# Patient Record
Sex: Male | Born: 2000 | Race: White | Hispanic: No | Marital: Single | Smoking: Never smoker
Health system: Southern US, Community
[De-identification: ages and names within clinical notes are randomized; demographics above are authoritative.]

---
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
## 2021-07-23 ENCOUNTER — Encounter: Payer: Self-pay | Admitting: Emergency Medicine

## 2021-07-23 ENCOUNTER — Ambulatory Visit
Admission: EM | Admit: 2021-07-23 | Discharge: 2021-07-23 | Disposition: A | Discharge status: Home / Self Care | Payer: Managed Care, Other (non HMO) | Attending: Emergency Medicine | Admitting: Emergency Medicine

## 2021-07-23 ENCOUNTER — Other Ambulatory Visit: Payer: Self-pay

## 2021-07-23 DIAGNOSIS — Z1152 Encounter for screening for COVID-19: Secondary | ICD-10-CM

## 2021-07-23 DIAGNOSIS — B349 Viral infection, unspecified: Secondary | ICD-10-CM

## 2021-07-23 LAB — POCT RAPID STREP A (OFFICE): Rapid Strep A Screen: NEGATIVE

## 2021-07-23 NOTE — ED Triage Notes (Signed)
Pt here with sore throat, congestion, headache, body aches, and cough x 4 days.

## 2021-07-23 NOTE — Discharge Instructions (Addendum)
Your strep test is negative.    Your COVID test is pending.  You should self quarantine until the test result is back.    Take Tylenol or ibuprofen as needed for fever or discomfort.  Rest and keep yourself hydrated.    Follow-up with your primary care provider if your symptoms are not improving.     

## 2021-07-23 NOTE — ED Provider Notes (Addendum)
[facility]    CSN: [id number] Arrival date & time: 07/23/21  1432      History   Chief Complaint Chief Complaint  Patient presents with   Nasal Congestion   Sore Throat   Cough   Headache   Generalized Body Aches    HPI Clinton Franklin is a 21 y.o. male.  Patient presents with 3 day history of body aches, sore throat, hoarse voice, congestion, sneezing, runny nose, sinus pressure, cough.  Treatment at home with guaifenesin, Sudafed, ibuprofen.  No fever, rash, shortness of breath, vomiting, diarrhea, or other symptoms.  His medical history includes Influenza A on 06/12/2021.    The history is provided by the patient and medical records.   History reviewed. No pertinent past medical history.  There are no problems to display for this patient.   History reviewed. No pertinent surgical history.     Home Medications    Prior to Admission medications   Not on File    Family History History reviewed. No pertinent family history.  Social History Social History   Tobacco Use   Smoking status: Never   Smokeless tobacco: Never  Substance Use Topics   Alcohol use: Never   Drug use: Never     Allergies   Patient has no known allergies.   Review of Systems Review of Systems  Constitutional:  Negative for chills and fever.  HENT:  Positive for congestion, nosebleeds, postnasal drip, sinus pressure, sneezing, sore throat and voice change. Negative for ear pain.   Respiratory:  Positive for cough. Negative for shortness of breath.   Cardiovascular:  Negative for chest pain and palpitations.  Gastrointestinal:  Negative for diarrhea and vomiting.  Skin:  Negative for color change and rash.  All other systems reviewed and are negative.   Physical Exam Triage Vital Signs ED Triage Vitals  Enc Vitals Group     BP      Pulse      Resp      Temp      Temp src      SpO2      Weight      Height      Head Circumference      Peak Flow      Pain  Score      Pain Loc      Pain Edu?      Excl. in GC?    No data found.  Updated Vital Signs BP (!) 153/83 (BP Location: Left Arm)    Pulse 89    Temp 98.2 F (36.8 C)    Resp 18    SpO2 97%   Visual Acuity Right Eye Distance:   Left Eye Distance:   Bilateral Distance:    Right Eye Near:   Left Eye Near:    Bilateral Near:     Physical Exam Vitals and nursing note reviewed.  Constitutional:      General: He is not in acute distress.    Appearance: He is well-developed.  HENT:     Right Ear: Tympanic membrane normal.     Left Ear: Tympanic membrane normal.     Nose: Congestion and rhinorrhea present.     Mouth/Throat:     Mouth: Mucous membranes are moist.     Pharynx: Posterior oropharyngeal erythema present.  Cardiovascular:     Rate and Rhythm: Normal rate and regular rhythm.     Heart sounds: Normal heart sounds.  Pulmonary:  Effort: Pulmonary effort is normal. No respiratory distress.     Breath sounds: Normal breath sounds.  Abdominal:     Palpations: Abdomen is soft.     Tenderness: There is no abdominal tenderness.  Musculoskeletal:     Cervical back: Neck supple.  Skin:    General: Skin is warm and dry.  Neurological:     Mental Status: He is alert.  Psychiatric:        Mood and Affect: Mood normal.        Behavior: Behavior normal.     UC Treatments / Results  Labs (all labs ordered are listed, but only abnormal results are displayed) Labs Reviewed  NOVEL CORONAVIRUS, NAA  POCT RAPID STREP A (OFFICE)    EKG   Radiology No results found.  Procedures Procedures (including critical care time)  Medications Ordered in UC Medications - No data to display  Initial Impression / Assessment and Plan / UC Course  I have reviewed the triage vital signs and the nursing notes.  Pertinent labs & imaging results that were available during my care of the patient were reviewed by me and considered in my medical decision making (see chart for  details).    Viral illness.  Patient had influenza A on 06/12/2021.  Rapid strep negative.  COVID pending.  Instructed patient to self quarantine per CDC guidelines.  Discussed symptomatic treatment including Tylenol or ibuprofen, rest, hydration.  Instructed patient to follow up with PCP if symptoms are not improving.  Patient agrees to plan of care.   Final Clinical Impressions(s) / UC Diagnoses   Final diagnoses:  Encounter for screening for COVID-19  Viral illness     Discharge Instructions      Your strep test is negative.  Your COVID test is pending.  You should self quarantine until the test result is back.    Take Tylenol or ibuprofen as needed for fever or discomfort.  Rest and keep yourself hydrated.    Follow-up with your primary care provider if your symptoms are not improving.         ED Prescriptions   None    PDMP not reviewed this encounter.   Mickie Bail, NP 07/23/21 1507    Mickie Bail, NP 07/23/21 [phone]

## 2021-07-24 LAB — NOVEL CORONAVIRUS, NAA: SARS-CoV-2, NAA: NOT DETECTED

## 2021-07-24 LAB — SARS-COV-2, NAA 2 DAY TAT

## 2022-05-21 ENCOUNTER — Other Ambulatory Visit: Payer: Self-pay

## 2022-05-21 NOTE — Progress Notes (Signed)
Pre-empoyment is cleared, resulted negative.
